# Patient Record
Sex: Female | Born: 1970 | Hispanic: Yes
Health system: Southern US, Community
[De-identification: ages and names within clinical notes are randomized; demographics above are authoritative.]

## PROBLEM LIST (undated history)

## (undated) DIAGNOSIS — N83209 Unspecified ovarian cyst, unspecified side: Secondary | ICD-10-CM

## (undated) HISTORY — PX: CHOLECYSTECTOMY: SHX55

---

## 2004-08-31 ENCOUNTER — Emergency Department: Payer: Self-pay | Admitting: Emergency Medicine

## 2005-06-14 ENCOUNTER — Other Ambulatory Visit: Payer: Self-pay

## 2005-06-14 ENCOUNTER — Emergency Department: Payer: Self-pay | Admitting: Emergency Medicine

## 2005-06-15 ENCOUNTER — Ambulatory Visit: Payer: Self-pay | Admitting: Emergency Medicine

## 2005-06-27 ENCOUNTER — Ambulatory Visit: Payer: Self-pay | Admitting: Vascular Surgery

## 2008-02-12 ENCOUNTER — Other Ambulatory Visit: Payer: Self-pay

## 2008-02-12 ENCOUNTER — Emergency Department: Payer: Self-pay | Admitting: Emergency Medicine

## 2009-01-31 ENCOUNTER — Ambulatory Visit: Payer: Self-pay | Admitting: Family Medicine

## 2012-04-01 ENCOUNTER — Emergency Department: Payer: Self-pay | Admitting: *Deleted

## 2012-04-01 LAB — COMPREHENSIVE METABOLIC PANEL
Albumin: 3.9 g/dL (ref 3.4–5.0)
Anion Gap: 8 (ref 7–16)
BUN: 13 mg/dL (ref 7–18)
Chloride: 105 mmol/L (ref 98–107)
EGFR (African American): >60
EGFR (Non-African Amer.): >60
Glucose: 110 mg/dL — ABNORMAL HIGH (ref 65–99)
Potassium: 3.8 mmol/L (ref 3.5–5.1)
SGOT(AST): 27 U/L (ref 15–37)
Total Protein: 8.7 g/dL — ABNORMAL HIGH (ref 6.4–8.2)

## 2012-04-01 LAB — URINALYSIS, COMPLETE
Bilirubin,UR: NEGATIVE
Blood: NEGATIVE
Glucose,UR: NEGATIVE mg/dL (ref 0–75)
Leukocyte Esterase: NEGATIVE
Nitrite: NEGATIVE
Protein: NEGATIVE
Specific Gravity: 1.006 (ref 1.003–1.030)
WBC UR: 1 /HPF (ref 0–5)

## 2012-04-01 LAB — CBC WITH DIFFERENTIAL/PLATELET
Basophil #: 0.1 10*3/uL (ref 0.0–0.1)
Basophil %: 0.9 %
Eosinophil #: 0.1 10*3/uL (ref 0.0–0.7)
Lymphocyte #: 2.7 10*3/uL (ref 1.0–3.6)
Lymphocyte %: 34.4 %
MCH: 29.9 pg (ref 26.0–34.0)
Monocyte #: 0.4 x10 3/mm (ref 0.2–0.9)
Monocyte %: 5.5 %
Neutrophil #: 4.5 10*3/uL (ref 1.4–6.5)
RBC: 3.86 10*6/uL (ref 3.80–5.20)
RDW: 12.7 % (ref 11.5–14.5)
WBC: 7.9 10*3/uL (ref 3.6–11.0)

## 2012-06-06 ENCOUNTER — Ambulatory Visit: Payer: Self-pay | Admitting: Family Medicine

## 2019-05-19 ENCOUNTER — Telehealth: Payer: Self-pay

## 2019-05-19 NOTE — Telephone Encounter (Signed)
Pre-visit [facility] call attempt:   "The call cannot be completed at this time." 2 attempts Interpreter: Herb Grays

## 2019-05-20 ENCOUNTER — Ambulatory Visit
Admission: RE | Admit: 2019-05-20 | Discharge: 2019-05-20 | Disposition: A | Discharge status: Home / Self Care | Payer: Self-pay | Source: Ambulatory Visit | Attending: Oncology | Admitting: Oncology

## 2019-05-20 ENCOUNTER — Ambulatory Visit: Payer: Self-pay | Attending: Oncology

## 2019-05-20 ENCOUNTER — Other Ambulatory Visit: Payer: Self-pay

## 2019-05-20 VITALS — BP 109/64 | HR 72 | Temp 97.1°F | Ht 60.0 in | Wt 161.0 lb

## 2019-05-20 DIAGNOSIS — Z0000 Encounter for general adult medical examination without abnormal findings: Secondary | ICD-10-CM

## 2019-05-20 NOTE — Progress Notes (Signed)
  Subjective:     Patient ID: Karen Knight, female   DOB: 1971/03/26, 48 y.o.   MRN: [id number]  HPI   Review of Systems     Objective:   Physical Exam Chest:     Breasts:        Right: Inverted nipple present. No swelling, bleeding, mass, nipple discharge, skin change or tenderness.        Left: No swelling, bleeding, inverted nipple, mass, nipple discharge, skin change or tenderness.        Assessment:     48 year old hispanic patient presents for [facility] clinic visit.  Virtual interpreter used for exam.  Patient screened, and meets [facility] eligibility.  Patient does not have insurance, Medicare or Medicaid. Instructed patient on breast self awareness using teach back method. Clinical breast exam unremarkable.  Patient states right nipple inversion is normal for her.  Risk Assessment    Risk Scores      05/20/2019   Last edited by: Rico Junker, RN   5-year risk: 0.4 %   Lifetime risk: 4.8 %            Plan:    Sent for bilateral screening mammogram.

## 2019-05-22 ENCOUNTER — Other Ambulatory Visit: Payer: Self-pay

## 2019-05-22 DIAGNOSIS — N63 Unspecified lump in unspecified breast: Secondary | ICD-10-CM

## 2019-06-05 ENCOUNTER — Ambulatory Visit
Admission: RE | Admit: 2019-06-05 | Discharge: 2019-06-05 | Disposition: A | Discharge status: Home / Self Care | Payer: Self-pay | Source: Ambulatory Visit | Attending: Oncology | Admitting: Oncology

## 2019-06-05 DIAGNOSIS — N63 Unspecified lump in unspecified breast: Secondary | ICD-10-CM

## 2019-06-09 NOTE — Progress Notes (Signed)
Letter mailed from [facility] to notify of normal mammogram results.  Patient to return in one year for annual screening.  Copy to [facility]. 

## 2019-11-26 IMAGING — US US BREAST*R* LIMITED INC AXILLA
1 series · 4 of 4 positions shown · non-contrast
Comparison: Previous exam(s).

CLINICAL DATA: Patient was called back from screening mammogram for
possible masses in both breast.

EXAM:
DIGITAL DIAGNOSTIC BILATERAL MAMMOGRAM WITH CAD AND TOMO
ULTRASOUND BILATERAL BREAST

[Series 1: us breast*right* limited inc axilla · 0.06mm/px · 4 of 4 slices shown]
[im 1/4]
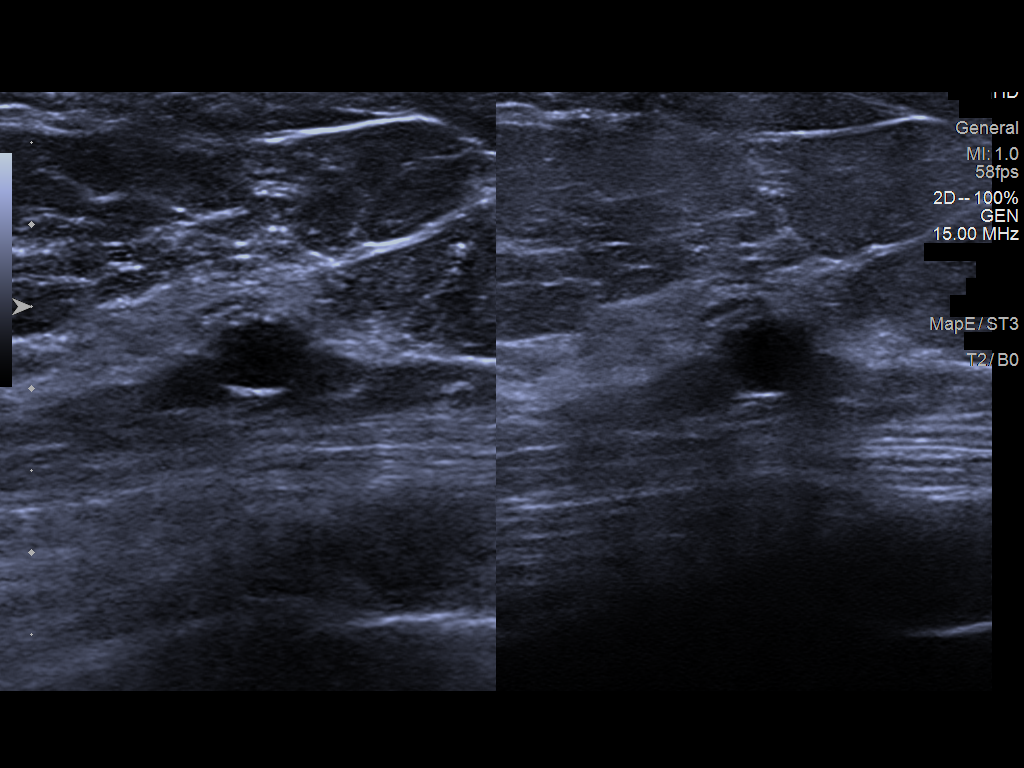
[im 2/4]
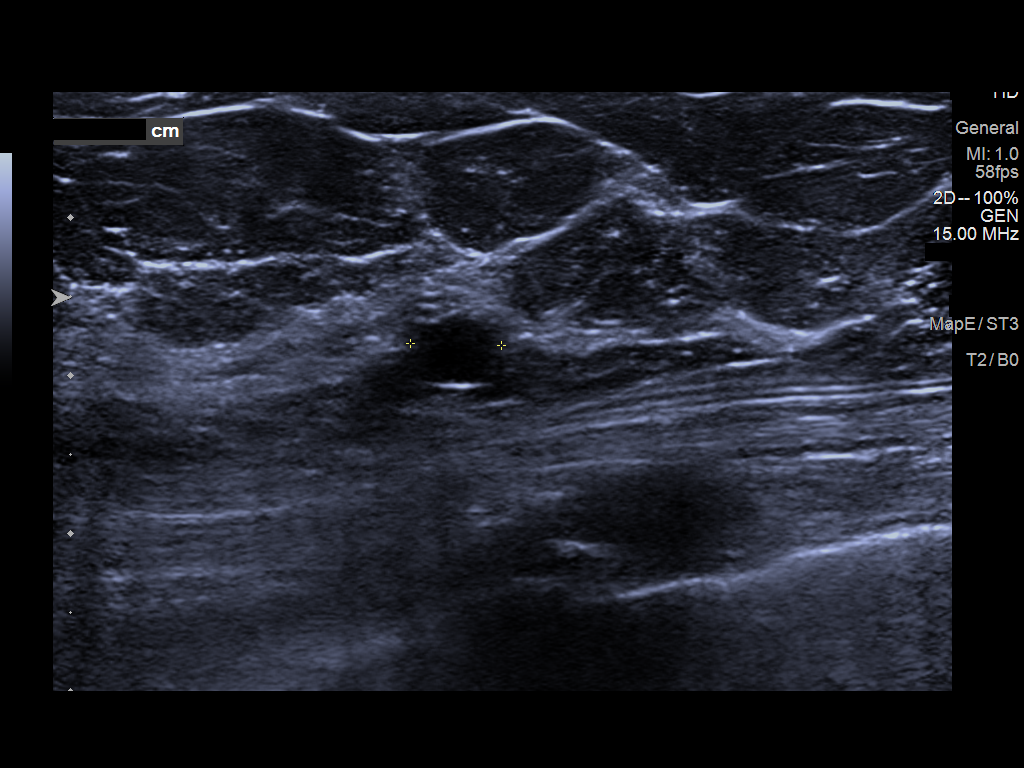
[im 3/4]
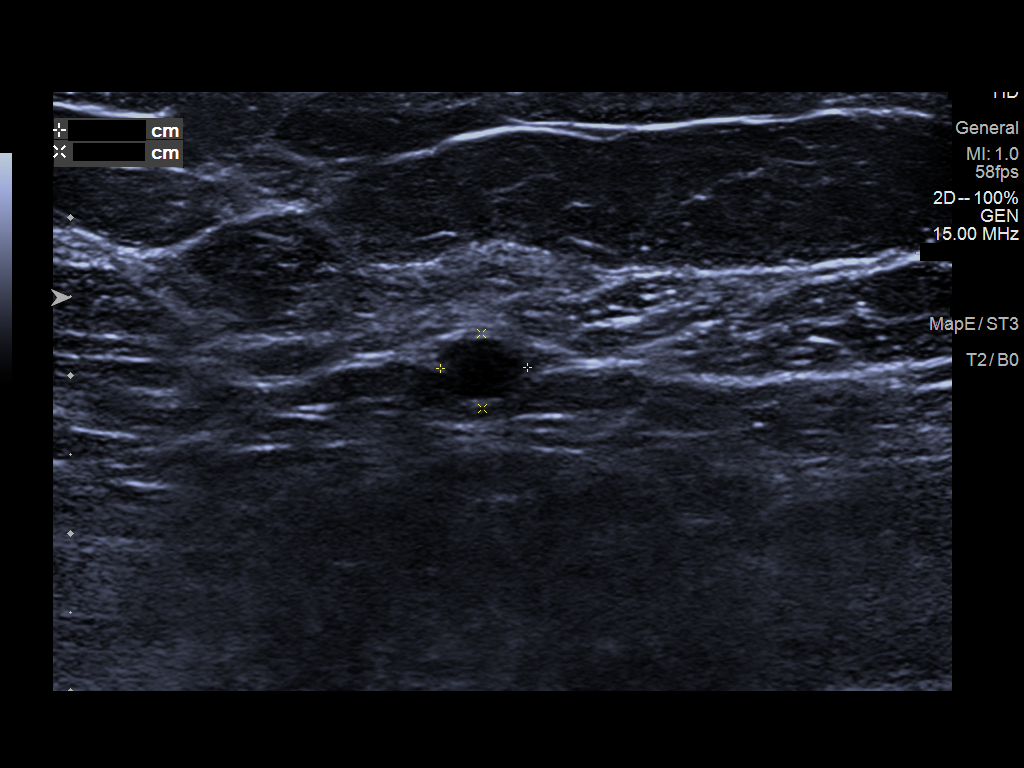
[im 4/4]
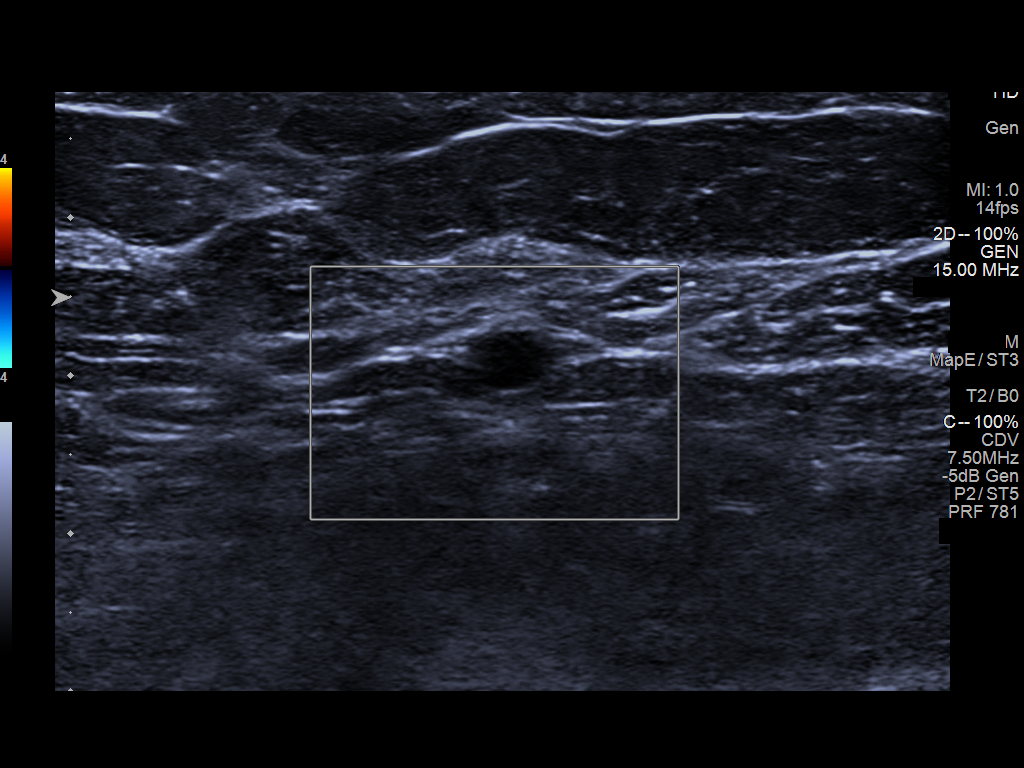

[4 of 4 positions shown; findings below may reference images not displayed]

ACR Breast Density Category b: There are scattered areas of
fibroglandular density.
FINDINGS: Additional imaging of both breast was performed. There is a 6 mm
mass in the upper inner quadrant of the right breast. There is a 4
mm mass in the medial subareolar region of the left breast. There
are no malignant type microcalcifications in either breast.

Mammographic images were processed with CAD.

Targeted ultrasound is performed, showing a well-circumscribed
anechoic cyst in the right breast at 2 o'clock 3 cm from the nipple
measuring 6 x 5 x 6 mm. There is a near anechoic cyst in the left
breast at 10 o'clock retroareolar region measuring 3 x 3 x 3 mm. No
suspicious mass identified in either breast.
IMPRESSION: Bilateral breast cysts.  No evidence of malignancy in either breast.

RECOMMENDATION:
Bilateral screening mammogram in 1 year is recommended.

I have discussed the findings and recommendations with the patient.
If applicable, a reminder letter will be sent to the patient
regarding the next appointment.

BI-RADS CATEGORY  2: Benign.

## 2020-09-27 ENCOUNTER — Other Ambulatory Visit: Payer: Self-pay | Admitting: Family Medicine

## 2020-09-27 DIAGNOSIS — Z1231 Encounter for screening mammogram for malignant neoplasm of breast: Secondary | ICD-10-CM

## 2020-10-24 ENCOUNTER — Other Ambulatory Visit: Payer: Self-pay

## 2020-10-24 ENCOUNTER — Ambulatory Visit
Admission: RE | Admit: 2020-10-24 | Discharge: 2020-10-24 | Disposition: A | Discharge status: Home / Self Care | Payer: 59 | Source: Ambulatory Visit | Attending: Family Medicine | Admitting: Family Medicine

## 2020-10-24 DIAGNOSIS — Z1231 Encounter for screening mammogram for malignant neoplasm of breast: Secondary | ICD-10-CM | POA: Diagnosis not present

## 2021-03-09 ENCOUNTER — Telehealth: Payer: Self-pay

## 2021-03-09 NOTE — Telephone Encounter (Signed)
Karen Knight referring for [facility] +hpv. Paper records. Left message with interpreter for patient to call back to be scheduled

## 2021-03-13 NOTE — Telephone Encounter (Signed)
Patient is scheduled for 04/11/21 with [facility]

## 2021-04-11 ENCOUNTER — Ambulatory Visit: Payer: Self-pay | Admitting: Obstetrics and Gynecology

## 2021-04-11 ENCOUNTER — Other Ambulatory Visit: Payer: Self-pay

## 2021-07-14 ENCOUNTER — Ambulatory Visit: Payer: 59 | Admitting: Obstetrics and Gynecology

## 2021-08-07 ENCOUNTER — Ambulatory Visit: Payer: 59 | Admitting: Obstetrics & Gynecology

## 2021-10-16 ENCOUNTER — Emergency Department: Payer: BLUE CROSS/BLUE SHIELD

## 2021-10-16 ENCOUNTER — Emergency Department
Admission: EM | Admit: 2021-10-16 | Discharge: 2021-10-16 | Disposition: A | Discharge status: Home / Self Care | Payer: BLUE CROSS/BLUE SHIELD | Attending: Emergency Medicine | Admitting: Emergency Medicine

## 2021-10-16 ENCOUNTER — Other Ambulatory Visit: Payer: Self-pay

## 2021-10-16 DIAGNOSIS — R1032 Left lower quadrant pain: Secondary | ICD-10-CM | POA: Diagnosis not present

## 2021-10-16 DIAGNOSIS — D649 Anemia, unspecified: Secondary | ICD-10-CM | POA: Diagnosis not present

## 2021-10-16 DIAGNOSIS — R110 Nausea: Secondary | ICD-10-CM | POA: Diagnosis present

## 2021-10-16 HISTORY — DX: Unspecified ovarian cyst, unspecified side: N83.209

## 2021-10-16 LAB — HCG, QUANTITATIVE, PREGNANCY: hCG, Beta Chain, Quant, S: 2 m[IU]/mL (ref <5)

## 2021-10-16 LAB — COMPREHENSIVE METABOLIC PANEL
ALT: 20 U/L (ref 0–44)
AST: 24 U/L (ref 15–41)
Albumin: 3.9 g/dL (ref 3.5–5.0)
Alkaline Phosphatase: 92 U/L (ref 38–126)
Anion gap: 7 (ref 5–15)
BUN: 16 mg/dL (ref 6–20)
CO2: 25 mmol/L (ref 22–32)
Calcium: 8.7 mg/dL — ABNORMAL LOW (ref 8.9–10.3)
Chloride: 106 mmol/L (ref 98–111)
Creatinine, Ser: 0.83 mg/dL (ref 0.44–1.00)
GFR, Estimated: >60 mL/min (ref >60)
Glucose, Bld: 93 mg/dL (ref 70–99)
Potassium: 3.7 mmol/L (ref 3.5–5.1)
Sodium: 138 mmol/L (ref 135–145)
Total Bilirubin: 0.3 mg/dL (ref 0.3–1.2)
Total Protein: 8 g/dL (ref 6.5–8.1)

## 2021-10-16 LAB — URINALYSIS, ROUTINE W REFLEX MICROSCOPIC
Bilirubin Urine: NEGATIVE
Glucose, UA: NEGATIVE mg/dL
Hgb urine dipstick: NEGATIVE
Ketones, ur: NEGATIVE mg/dL
Leukocytes,Ua: NEGATIVE
Nitrite: NEGATIVE
Protein, ur: NEGATIVE mg/dL
Specific Gravity, Urine: 1.006 (ref 1.005–1.030)
pH: 6 (ref 5.0–8.0)

## 2021-10-16 LAB — CBC
HCT: 31.2 % — ABNORMAL LOW (ref 36.0–46.0)
Hemoglobin: 10.3 g/dL — ABNORMAL LOW (ref 12.0–15.0)
MCH: 29.1 pg (ref 26.0–34.0)
MCHC: 33 g/dL (ref 30.0–36.0)
MCV: 88.1 fL (ref 80.0–100.0)
Platelets: 395 10*3/uL (ref 150–400)
RBC: 3.54 MIL/uL — ABNORMAL LOW (ref 3.87–5.11)
RDW: 12.2 % (ref 11.5–15.5)
WBC: 8.9 10*3/uL (ref 4.0–10.5)
nRBC: 0 % (ref 0.0–0.2)

## 2021-10-16 LAB — LIPASE, BLOOD: Lipase: 36 U/L (ref 11–51)

## 2021-10-16 MED ORDER — ONDANSETRON HCL 4 MG/2ML IJ SOLN
4.0000 mg | INTRAMUSCULAR | Status: AC
  Administered 2021-10-16: 4 mg via INTRAVENOUS
  Filled 2021-10-16: qty 2

## 2021-10-16 MED ORDER — IOHEXOL 300 MG/ML  SOLN
80.0000 mL | Freq: Once | INTRAMUSCULAR | Status: AC | PRN
  Administered 2021-10-16: 80 mL via INTRAVENOUS
  Filled 2021-10-16: qty 80

## 2021-10-16 MED ORDER — ACETAMINOPHEN 500 MG PO TABS
1000.0000 mg | ORAL_TABLET | ORAL | Status: AC
  Administered 2021-10-16: 1000 mg via ORAL
  Filled 2021-10-16: qty 2

## 2021-10-16 MED ORDER — ONDANSETRON 4 MG PO TBDP: 4.0000 mg | ORAL_TABLET | Freq: Four times a day (QID) | ORAL | 0 refills | Status: AC | PRN

## 2021-10-16 MED ORDER — SODIUM CHLORIDE 0.9 % IV BOLUS
500.0000 mL | Freq: Once | INTRAVENOUS | Status: AC
  Administered 2021-10-16: 500 mL via INTRAVENOUS

## 2021-10-16 NOTE — ED Notes (Signed)
Lt green, dark green, lav and red tubes sent to lab.  ?

## 2021-10-16 NOTE — Discharge Instructions (Signed)
? ?  Please return to the emergency room right away if you are to develop a fever, severe nausea, your pain becomes severe or worsens, you are unable to keep food down, begin vomiting any dark or bloody fluid, you develop any dark or bloody stools, feel dehydrated, or other new concerns or symptoms arise. ? ?

## 2021-10-16 NOTE — ED Provider Notes (Signed)
? ?[facility] ?Provider Note ? ? ? Event Date/Time  ? First MD Initiated Contact with Patient 10/16/21 2056   ?  (approximate) ? ? ?History  ? ?Abdominal Pain ?Telemetry interpreter service utilized throughout including initial evaluation, review of imaging and lab studies, and discharge recommendations and follow-up ? ?HPI ? ?Karen Knight is a 51 y.o. female  who on review of clinic note from 05/2019 has been seen for breast cancer screening and left inverted nipple ? ?Patient reports no major medical illness no routine prescriptions.  Allergic to penicillin ? ?For 2 weeks has had a persistent moderate pain in her left lower abdomen.  Associated with mild nausea.  Still eating and drinking no vomiting no diarrhea.  No chest pains.  No vaginal discharge or bleeding.  She reports she went through menopause and has not had a menstrual cycle for approximately 13 years now ? ?No previous history of abdominal surgeries. ? ?Currently pain is mild to moderate located in her left lower abdomen.  Nausea ? ?Bowel movement yesterday, normal ? ? ?Physical Exam  ? ?Triage Vital Signs: ?ED Triage Vitals [10/16/21 1727]  ?Enc Vitals Group  ?   BP 125/68  ?   Pulse Rate 81  ?   Resp 17  ?   Temp   ?   Temp Source Oral  ?   SpO2 98 %  ?   Weight 160 lb (72.6 kg)  ?   Height 5\' 3"  (1.6 m)  ?   Head Circumference   ?   Peak Flow   ?   Pain Score 8  ?   Pain Loc   ?   Pain Edu?   ?   Excl. in [place]?   ? ? ?Most recent vital signs: ?Vitals:  ? 10/16/21 2252 10/16/21 2253  ?BP:  (!) 113/58  ?Pulse: 68   ?Resp:  18  ?SpO2:  95%  ? ? ? ?General: Awake, no distress.  Pleasant.  In no distress ?CV:  Good peripheral perfusion.  ?Resp:  Normal effort.  Clear speech and normal work of breathing ?Abd:  No distention.  Abdomen soft nontender nondistended in all quadrants except reports mild to moderate tenderness without rebound or guarding to palpation in the lateral left lower quadrant.  No overlying skin lesions  swelling or rash.  Negative Murphy.  Pain is reproducible in the left lower quadrant, but no pain to percussion or rebound tenderness ?Other:  Warm well-perfused extremities ? ? ?ED Results / Procedures / Treatments  ? ?Labs ?(all labs ordered are listed, but only abnormal results are displayed) ?Labs Reviewed  ?COMPREHENSIVE METABOLIC PANEL - Abnormal; Notable for the following components:  ?    Result Value  ? Calcium 8.7 (*)   ? All other components within normal limits  ?CBC - Abnormal; Notable for the following components:  ? RBC 3.54 (*)   ? Hemoglobin 10.3 (*)   ? HCT 31.2 (*)   ? All other components within normal limits  ?URINALYSIS, ROUTINE W REFLEX MICROSCOPIC - Abnormal; Notable for the following components:  ? Color, Urine STRAW (*)   ? APPearance CLEAR (*)   ? All other components within normal limits  ?LIPASE, BLOOD  ?HCG, QUANTITATIVE, PREGNANCY  ? ? ? ?EKG ? ? ? ? ?RADIOLOGY ?CT of the abdomen pelvis reviewed by me and interpreted for gross pathology, I do not do note acute gross pathology ? ? ?Radiologist report as below ?IMPRESSION: ?No acute  intra-abdominal pathology identified. No definite ?radiographic explanation for the patient's reported symptoms. ?  ?Mild hepatic steatosis. ? ? ? ? ?PROCEDURES: ? ?Critical Care performed: No ? ?Procedures ? ? ?MEDICATIONS ORDERED IN ED: ?Medications  ?acetaminophen (TYLENOL) tablet 1,000 mg (1,000 mg Oral Given 10/16/21 2223)  ?ondansetron [facility]) injection 4 mg (4 mg Intravenous Given 10/16/21 2222)  ?sodium chloride 0.9 % bolus 500 mL (500 mLs Intravenous New Bag/Given 10/16/21 2250)  ?iohexol (OMNIPAQUE) 300 MG/ML solution 80 mL (80 mLs Intravenous Contrast Given 10/16/21 2235)  ? ? ? ?IMPRESSION / MDM / ASSESSMENT AND PLAN / ED COURSE  ?I reviewed the triage vital signs and the nursing notes. ?             ?               ? ?Differential diagnosis includes but is not limited to, abdominal perforation, aortic dissection, cholecystitis, appendicitis,  diverticulitis, colitis, esophagitis/gastritis, kidney stone, pyelonephritis, urinary tract infection, aortic aneurysm. All are considered in decision and treatment plan. Based upon the patient's presentation and risk factors, given the left lower quadrant nature considerations would be for diverticulitis colitis less likely appendicitis.  No right upper quadrant pain.  No vaginal bleeding or vaginal discharge.  No acute GYN symptoms.  Pregnancy test negative ? ?Given 2 weeks and reproducible pain in the left lower quadrant we will proceed with CT imaging to further evaluate. ? ? ? ?Clinical Course as of 10/16/21 2311  ?Mon Oct 16, 2021  ?2159 Patient reports PCP is A Adam. She reports history of anemia.  [MQ]  ?[place] interpreter utilized. [MQ]  ?  ?Clinical Course User Index ?[MQ] Delman Kitten, MD  ? ?Labs reviewed, urinalysis negative for acute finding.  Normal CBC except for mild anemia which the patient reports a history of an review of fairly distant lab records does demonstrate anemia in the past.  Normal CBC and LFTs ? ?----------------------------------------- ?11:08 PM on 10/16/2021 ?----------------------------------------- ?Reviewed imaging studies and results with the patient.  She is comfortable with plan for discharge and follow-up with her primary care physician who she reports has as Dr. Andree Elk.  Reassuring clinical exam.  Patient reports nausea is improved.  Will discharge to home, prescription for Zofran.  Reviewed careful abdominal pain return precautions.  Patient in agreement ? ?Return precautions and treatment recommendations and follow-up discussed with the patient who is agreeable with the plan. ? ? ?FINAL CLINICAL IMPRESSION(S) / ED DIAGNOSES  ? ?Final diagnoses:  ?LLQ abdominal pain  ?Nausea  ? ? ? ?Rx / DC Orders  ? ?ED Discharge Orders   ? ?      Ordered  ?  ondansetron (ZOFRAN-ODT) 4 MG disintegrating tablet  Every 6 hours PRN       ? 10/16/21 2311  ? ?  ?  ? ?  ? ? ? ?Note:   This document was prepared using Dragon voice recognition software and may include unintentional dictation errors. ?  Delman Kitten, MD ?10/16/21 2311 ? ?

## 2021-10-16 NOTE — ED Triage Notes (Signed)
Pt c/o LLQ pain with Nausea for the past 10 days, last BM was yesterday.  ?

## 2021-10-16 NOTE — ED Notes (Addendum)
See triage note. Pt in with L sided abdominal pain; pt reports "it feels like a ball is there that moves around and makes me nauseous". Pt non-tender at site. Pt laying calmly on stretcher; resp reg/unlabored; skin dry. Pt reports urination has been normal and last BM was today; states has been eating less due to getting nauseous every time she eats. Interpeter [phone] assisting during assessment and notifying pt of plan.  ?

## 2021-10-16 NOTE — ED Notes (Addendum)
Pt leaving for CT. Will hang fluids once pt back from CT.  ?

## 2022-04-08 IMAGING — CT CT ABD-PELV W/ CM
2 of 5 series · 16 of 46 positions shown, 18 images · IV contrast (APPLIED)
Comparison: None.

CLINICAL DATA: Left lower quadrant abdominal pain, nausea

EXAM:
CT ABDOMEN AND PELVIS WITH CONTRAST
TECHNIQUE: Multidetector CT imaging of the abdomen and pelvis was performed
using the standard protocol following bolus administration of
intravenous contrast.

[Series 2: abdomen 5.0 · axial · 0.80mm/px · z∈[-1008,-588]mm · 13 of 95 slices shown, 15 images]
[im 6/95  soft-tissue]
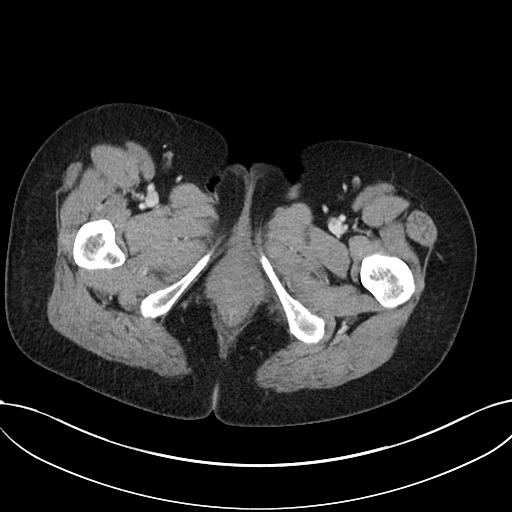
[im 6/95  bone]
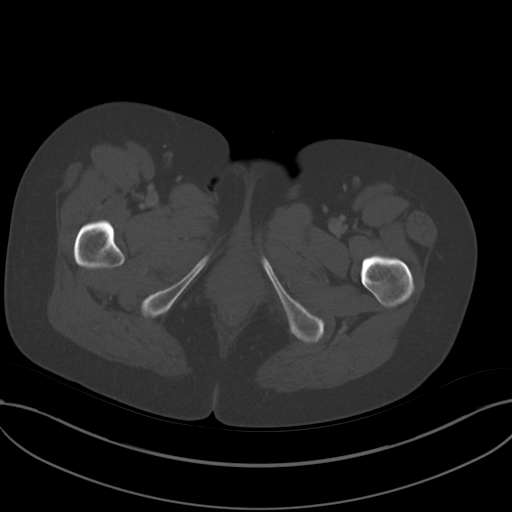
[im 12/95  soft-tissue]
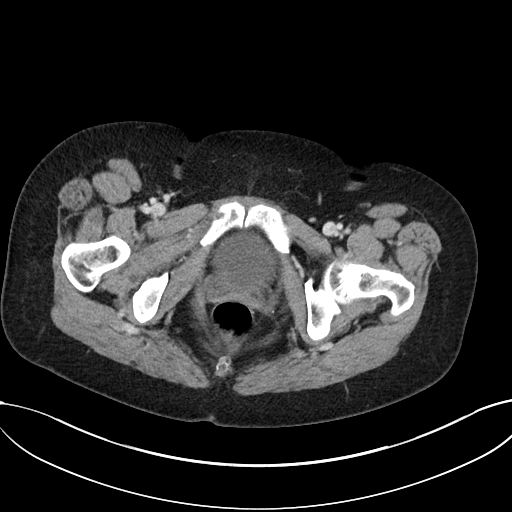
[im 18/95  soft-tissue]
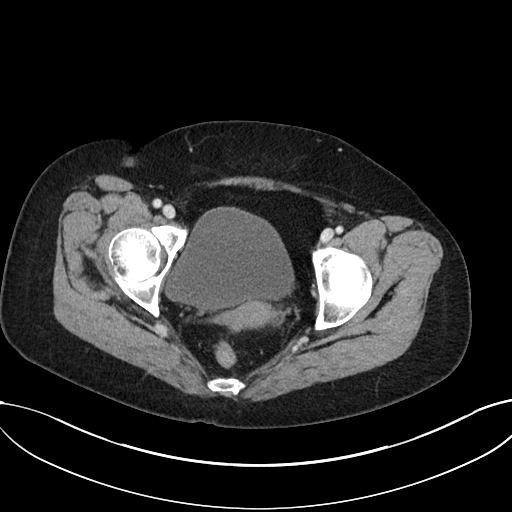
[im 30/95  soft-tissue]
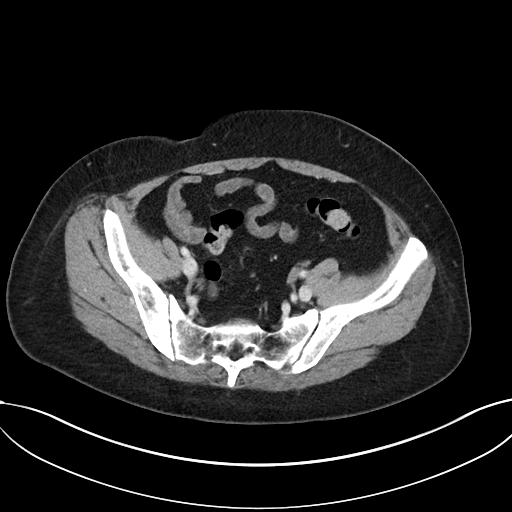
[im 36/95  soft-tissue]
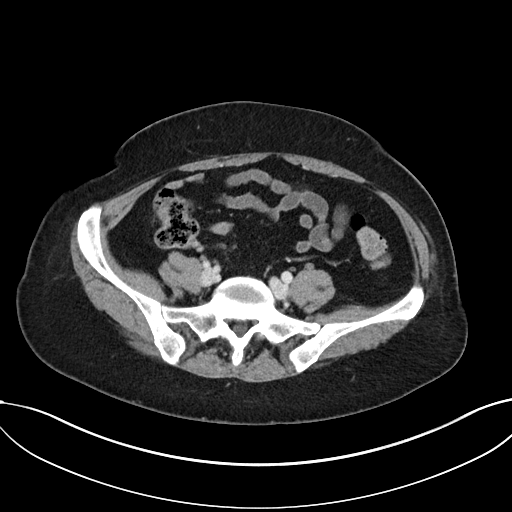
[im 42/95  soft-tissue]
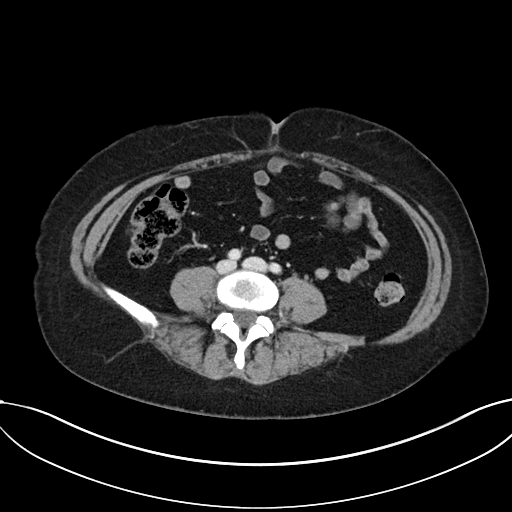
[im 48/95  soft-tissue]
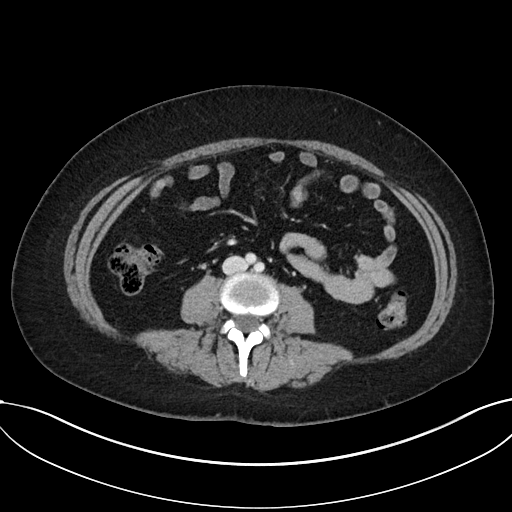
[im 53/95  soft-tissue]
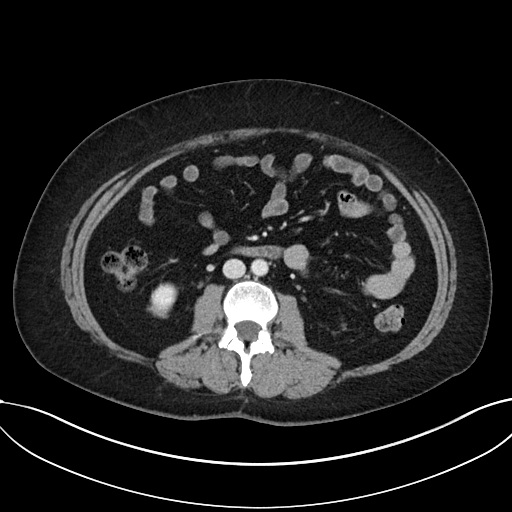
[im 59/95  soft-tissue]
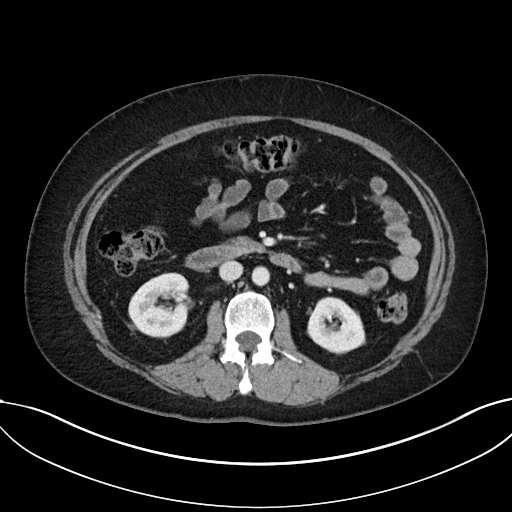
[im 59/95  bone]
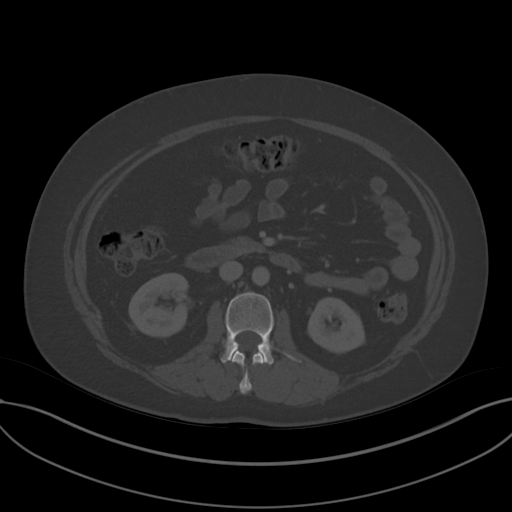
[im 65/95  soft-tissue]
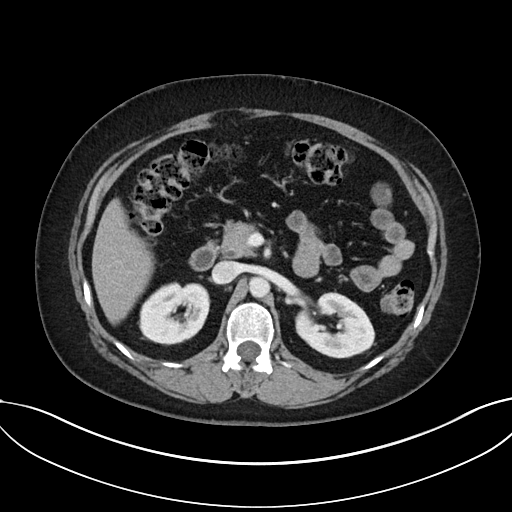
[im 77/95  soft-tissue]
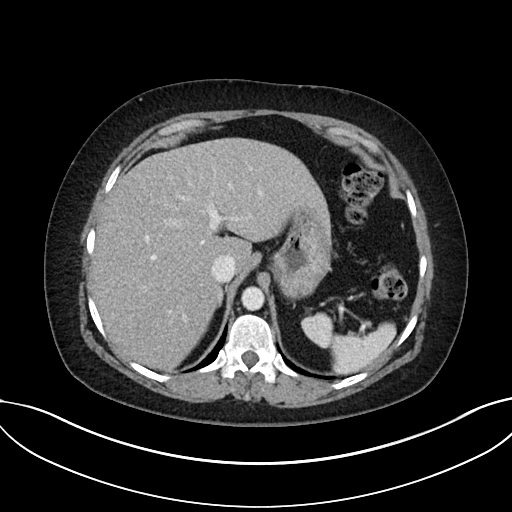
[im 83/95  soft-tissue]
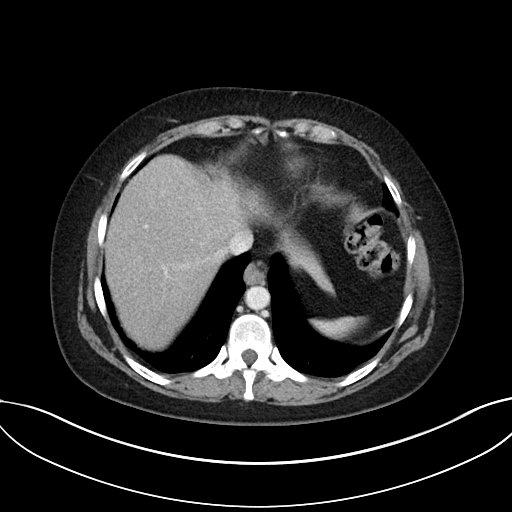
[im 89/95  soft-tissue]
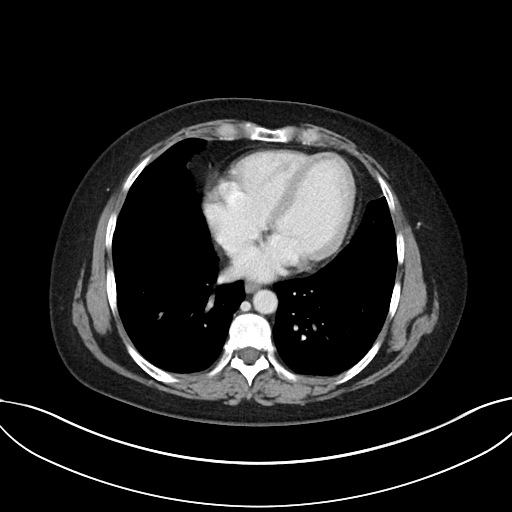

[Series 5: abdomen 3.0 mpr cor · coronal · 0.76mm/px · 3 of 97 slices shown]
[im 33/97  soft-tissue]
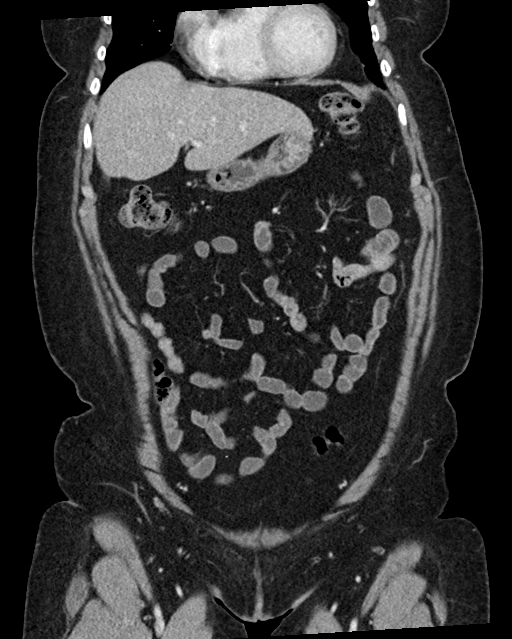
[im 43/97  soft-tissue]
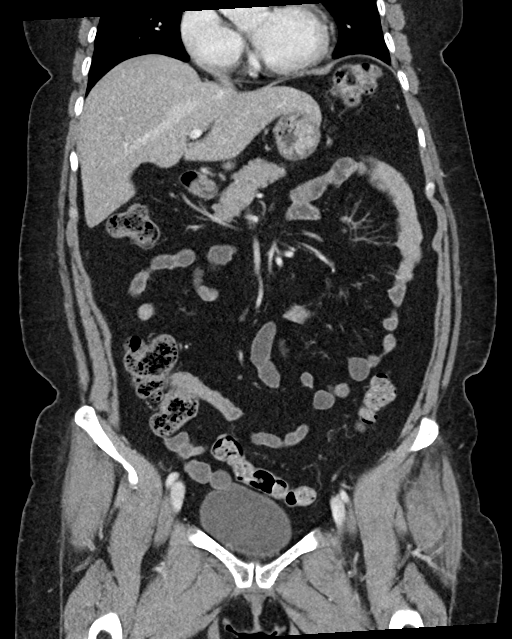
[im 54/97  soft-tissue]
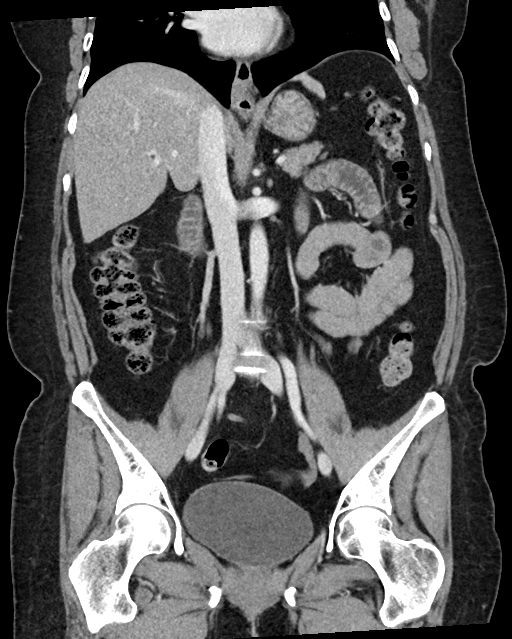

[16 of 46 positions shown; findings below may reference images not displayed]

RADIATION DOSE REDUCTION: This exam was performed according to the
departmental dose-optimization program which includes automated
exposure control, adjustment of the mA and/or kV according to
patient size and/or use of iterative reconstruction technique.

CONTRAST:  80mL OMNIPAQUE IOHEXOL 300 MG/ML  SOLN
FINDINGS: Lower chest: No acute abnormality.

Hepatobiliary: Mild hepatic steatosis. No enhancing intrahepatic
mass. No intra or extrahepatic biliary ductal dilation. Status post
cholecystectomy.

Pancreas: Unremarkable

Spleen: Unremarkable

Adrenals/Urinary Tract: Adrenal glands are unremarkable. Kidneys are
normal, without renal calculi, focal lesion, or hydronephrosis.
Bladder is unremarkable.

Stomach/Bowel: Stomach is within normal limits. Appendix appears
normal. No evidence of bowel wall thickening, distention, or
inflammatory changes.

Vascular/Lymphatic: No significant vascular findings are present. No
enlarged abdominal or pelvic lymph nodes.

Reproductive: Uterus and bilateral adnexa are unremarkable.

Other: No abdominal wall hernia.  Rectum unremarkable.

Musculoskeletal: No acute bone abnormality. No lytic or blastic bone
lesion.
IMPRESSION: No acute intra-abdominal pathology identified. No definite
radiographic explanation for the patient's reported symptoms.

Mild hepatic steatosis.

## 2022-12-28 ENCOUNTER — Emergency Department
Admission: EM | Admit: 2022-12-28 | Discharge: 2022-12-28 | Disposition: A | Discharge status: Home / Self Care | Payer: BLUE CROSS/BLUE SHIELD | Attending: Emergency Medicine | Admitting: Emergency Medicine

## 2022-12-28 ENCOUNTER — Emergency Department: Payer: BLUE CROSS/BLUE SHIELD

## 2022-12-28 ENCOUNTER — Encounter: Payer: Self-pay | Admitting: Emergency Medicine

## 2022-12-28 ENCOUNTER — Other Ambulatory Visit: Payer: Self-pay

## 2022-12-28 DIAGNOSIS — K2900 Acute gastritis without bleeding: Secondary | ICD-10-CM | POA: Insufficient documentation

## 2022-12-28 DIAGNOSIS — R1010 Upper abdominal pain, unspecified: Secondary | ICD-10-CM | POA: Diagnosis present

## 2022-12-28 LAB — COMPREHENSIVE METABOLIC PANEL
ALT: 44 U/L (ref 0–44)
AST: 52 U/L — ABNORMAL HIGH (ref 15–41)
Albumin: 3.8 g/dL (ref 3.5–5.0)
Alkaline Phosphatase: 95 U/L (ref 38–126)
Anion gap: 11 (ref 5–15)
BUN: 21 mg/dL — ABNORMAL HIGH (ref 6–20)
CO2: 21 mmol/L — ABNORMAL LOW (ref 22–32)
Calcium: 9.3 mg/dL (ref 8.9–10.3)
Chloride: 107 mmol/L (ref 98–111)
Creatinine, Ser: 0.96 mg/dL (ref 0.44–1.00)
GFR, Estimated: >60 mL/min (ref >60)
Glucose, Bld: 117 mg/dL — ABNORMAL HIGH (ref 70–99)
Potassium: 3.8 mmol/L (ref 3.5–5.1)
Sodium: 139 mmol/L (ref 135–145)
Total Bilirubin: 0.3 mg/dL (ref 0.3–1.2)
Total Protein: 8 g/dL (ref 6.5–8.1)

## 2022-12-28 LAB — CBC
HCT: 31.7 % — ABNORMAL LOW (ref 36.0–46.0)
Hemoglobin: 10.1 g/dL — ABNORMAL LOW (ref 12.0–15.0)
MCH: 29.4 pg (ref 26.0–34.0)
MCHC: 31.9 g/dL (ref 30.0–36.0)
MCV: 92.4 fL (ref 80.0–100.0)
Platelets: 365 10*3/uL (ref 150–400)
RBC: 3.43 MIL/uL — ABNORMAL LOW (ref 3.87–5.11)
RDW: 12.4 % (ref 11.5–15.5)
WBC: 9.4 10*3/uL (ref 4.0–10.5)
nRBC: 0 % (ref 0.0–0.2)

## 2022-12-28 LAB — LIPASE, BLOOD: Lipase: 32 U/L (ref 11–51)

## 2022-12-28 MED ORDER — MORPHINE SULFATE (PF) 4 MG/ML IV SOLN
4.0000 mg | Freq: Once | INTRAVENOUS | Status: AC
  Administered 2022-12-28: 4 mg via INTRAVENOUS
  Filled 2022-12-28: qty 1

## 2022-12-28 MED ORDER — OMEPRAZOLE MAGNESIUM 20 MG PO TBEC: 20.0000 mg | DELAYED_RELEASE_TABLET | Freq: Every day | ORAL | 1 refills | Status: AC

## 2022-12-28 MED ORDER — ONDANSETRON HCL 4 MG/2ML IJ SOLN
4.0000 mg | Freq: Once | INTRAMUSCULAR | Status: AC
  Administered 2022-12-28: 4 mg via INTRAVENOUS
  Filled 2022-12-28: qty 2

## 2022-12-28 NOTE — ED Provider Notes (Signed)
[facility] Provider Note    Event Date/Time   First MD Initiated Contact with Patient 12/28/22 1058     (approximate)   History   Abdominal Pain (/)  Spanish interpreted used HPI  Karen Knight is a 52 y.o. female who presents with complaints of upper abdominal pain that started approximately 45 minutes prior to arrival.  Does have a history of gallstones reportedly.  No nausea or vomiting, describes the pain as moderate to severe     Physical Exam   Triage Vital Signs: ED Triage Vitals  Enc Vitals Group     BP 12/28/22 1100 (!) 114/57     Pulse Rate 12/28/22 1100 (!) 55     Resp 12/28/22 1100 18     Temp 12/28/22 1100 97.6 F (36.4 C)     Temp Source 12/28/22 1100 Oral     SpO2 12/28/22 1100 100 %     Weight 12/28/22 1101 70.3 kg (155 lb)     Height --      Head Circumference --      Peak Flow --      Pain Score 12/28/22 1100 10     Pain Loc --      Pain Edu? --      Excl. in [place]? --     Most recent vital signs: Vitals:   12/28/22 1145 12/28/22 1200  BP:  (!) 113/57  Pulse: 68 63  Resp: 12 11  Temp:    SpO2: 92% 95%     General: Awake, no distress.  CV:  Good peripheral perfusion.  Resp:  Normal effort.  Abd:  No distention.  Positive epigastric tenderness Other:     ED Results / Procedures / Treatments   Labs (all labs ordered are listed, but only abnormal results are displayed) Labs Reviewed  COMPREHENSIVE METABOLIC PANEL - Abnormal; Notable for the following components:      Result Value   CO2 21 (*)    Glucose, Bld 117 (*)    BUN 21 (*)    AST 52 (*)    All other components within normal limits  CBC - Abnormal; Notable for the following components:   RBC 3.43 (*)    Hemoglobin 10.1 (*)    HCT 31.7 (*)    All other components within normal limits  LIPASE, BLOOD  URINALYSIS, ROUTINE W REFLEX MICROSCOPIC     EKG     RADIOLOGY     PROCEDURES:  Critical Care performed:    Procedures   MEDICATIONS ORDERED IN ED: Medications  morphine (PF) 4 MG/ML injection 4 mg (4 mg Intravenous Given 12/28/22 1123)  ondansetron (ZOFRAN) injection 4 mg (4 mg Intravenous Given 12/28/22 1123)     IMPRESSION / MDM / ASSESSMENT AND PLAN / ED COURSE  I reviewed the triage vital signs and the nursing notes. Patient's presentation is most consistent with acute presentation with potential threat to life or bodily function.  Patient presents with abdominal pain as detailed above, suspicious for biliary colic given reported history of gallstones versus gastritis versus pancreatitis.  Treat with IV morphine, IV Zofran, obtain labs, ultrasound of the right upper quadrant and reevaluate.  Notified by ultrasound tech that the patient appears to have absent gallbladder, patient clarifies that she thought the gallstones were removed but did not realize the entire gallbladder was removed.  Patient feeling better after treatment, lab work reviewed and is unremarkable, normal lipase, normal CMP, suspect gastritis as a cause  of her discomfort she is asymptomatic now.  Appropriate for discharge, will start omeprazole, return precautions discussed, outpatient follow-up.     FINAL CLINICAL IMPRESSION(S) / ED DIAGNOSES   Final diagnoses:  Acute gastritis without hemorrhage, unspecified gastritis type     Rx / DC Orders   ED Discharge Orders          Ordered    omeprazole (PRILOSEC OTC) 20 MG tablet  Daily        12/28/22 1306             Note:  This document was prepared using Dragon voice recognition software and may include unintentional dictation errors.   Jene Every, MD 12/28/22 1330

## 2022-12-28 NOTE — ED Triage Notes (Signed)
Patient to ED via Pov for abd pain that started approx 45 minutes PTA. Patient states nausea but denies V/D. Patient groaning in pain and sweating. Hx of gallstones.

## 2023-04-24 ENCOUNTER — Other Ambulatory Visit: Payer: Self-pay | Admitting: Family Medicine

## 2023-04-24 DIAGNOSIS — Z1231 Encounter for screening mammogram for malignant neoplasm of breast: Secondary | ICD-10-CM

## 2023-05-24 ENCOUNTER — Ambulatory Visit
Admission: RE | Admit: 2023-05-24 | Discharge: 2023-05-24 | Disposition: A | Discharge status: Home / Self Care | Payer: BLUE CROSS/BLUE SHIELD | Source: Ambulatory Visit | Attending: Family Medicine | Admitting: Family Medicine

## 2023-05-24 DIAGNOSIS — Z1231 Encounter for screening mammogram for malignant neoplasm of breast: Secondary | ICD-10-CM | POA: Insufficient documentation

## 2023-06-04 ENCOUNTER — Other Ambulatory Visit: Payer: Self-pay | Admitting: Family Medicine

## 2023-06-04 DIAGNOSIS — R928 Other abnormal and inconclusive findings on diagnostic imaging of breast: Secondary | ICD-10-CM

## 2023-06-10 ENCOUNTER — Other Ambulatory Visit: Payer: BLUE CROSS/BLUE SHIELD

## 2023-06-10 ENCOUNTER — Inpatient Hospital Stay: Admission: RE | Admit: 2023-06-10 | Payer: BLUE CROSS/BLUE SHIELD | Source: Ambulatory Visit

## 2023-06-18 ENCOUNTER — Ambulatory Visit
Admission: RE | Admit: 2023-06-18 | Discharge: 2023-06-18 | Disposition: A | Discharge status: Home / Self Care | Payer: BLUE CROSS/BLUE SHIELD | Source: Ambulatory Visit | Attending: Family Medicine | Admitting: Family Medicine

## 2023-06-18 DIAGNOSIS — R928 Other abnormal and inconclusive findings on diagnostic imaging of breast: Secondary | ICD-10-CM

## 2024-07-07 ENCOUNTER — Encounter: Payer: Self-pay | Admitting: Family Medicine

## 2024-07-07 ENCOUNTER — Telehealth: Payer: Self-pay

## 2024-07-24 ENCOUNTER — Other Ambulatory Visit: Payer: Self-pay | Admitting: Family Medicine

## 2024-07-24 DIAGNOSIS — Z1231 Encounter for screening mammogram for malignant neoplasm of breast: Secondary | ICD-10-CM

## 2024-08-20 ENCOUNTER — Encounter
